# Patient Record
Sex: Female | Born: 2000 | Race: Black or African American | Hispanic: No | Marital: Single | Smoking: Never smoker
Health system: Southern US, Community
[De-identification: ages and names within clinical notes are randomized; demographics above are authoritative.]

---
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
## 2019-01-27 ENCOUNTER — Other Ambulatory Visit: Payer: Self-pay

## 2019-01-27 DIAGNOSIS — Z20822 Contact with and (suspected) exposure to covid-19: Secondary | ICD-10-CM

## 2019-01-27 DIAGNOSIS — Z20828 Contact with and (suspected) exposure to other viral communicable diseases: Secondary | ICD-10-CM | POA: Diagnosis not present

## 2019-01-28 LAB — NOVEL CORONAVIRUS, NAA: SARS-CoV-2, NAA: NOT DETECTED

## 2019-12-27 DIAGNOSIS — B9689 Other specified bacterial agents as the cause of diseases classified elsewhere: Secondary | ICD-10-CM | POA: Diagnosis not present

## 2019-12-27 DIAGNOSIS — N760 Acute vaginitis: Secondary | ICD-10-CM | POA: Diagnosis not present

## 2019-12-27 DIAGNOSIS — N898 Other specified noninflammatory disorders of vagina: Secondary | ICD-10-CM | POA: Diagnosis not present

## 2019-12-27 DIAGNOSIS — N762 Acute vulvitis: Secondary | ICD-10-CM | POA: Diagnosis not present

## 2020-06-24 DIAGNOSIS — U071 COVID-19: Secondary | ICD-10-CM | POA: Diagnosis not present

## 2020-06-24 DIAGNOSIS — R059 Cough, unspecified: Secondary | ICD-10-CM | POA: Diagnosis not present

## 2020-06-26 ENCOUNTER — Telehealth: Payer: Self-pay

## 2020-06-26 NOTE — Telephone Encounter (Signed)
Transition Care Management Unsuccessful Follow-up Telephone Call  Date of discharge and from where:  06/25/2020 from [facility]  Attempts:  1st Attempt  Reason for unsuccessful TCM follow-up call:  Left voice message

## 2020-06-27 NOTE — Telephone Encounter (Signed)
Transition Care Management Unsuccessful Follow-up Telephone Call  Date of discharge and from where:  06/25/2020 from [facility]  Attempts:  2nd Attempt  Reason for unsuccessful TCM follow-up call:  Left voice message

## 2020-06-28 NOTE — Telephone Encounter (Signed)
Transition Care Management Unsuccessful Follow-up Telephone Call  Date of discharge and from where:  06/25/2020 from [facility]  Attempts:  3rd Attempt  Reason for unsuccessful TCM follow-up call:  Unable to reach patient

## 2020-12-06 DIAGNOSIS — Z113 Encounter for screening for infections with a predominantly sexual mode of transmission: Secondary | ICD-10-CM | POA: Diagnosis not present

## 2020-12-06 DIAGNOSIS — B3731 Acute candidiasis of vulva and vagina: Secondary | ICD-10-CM | POA: Diagnosis not present

## 2021-08-04 ENCOUNTER — Encounter (HOSPITAL_COMMUNITY): Payer: Self-pay | Admitting: Emergency Medicine

## 2021-08-04 ENCOUNTER — Emergency Department (HOSPITAL_COMMUNITY)
Admission: EM | Admit: 2021-08-04 | Discharge: 2021-08-05 | Disposition: A | Discharge status: Home / Self Care | Payer: PRIVATE HEALTH INSURANCE | Attending: Emergency Medicine | Admitting: Emergency Medicine

## 2021-08-04 DIAGNOSIS — S61412A Laceration without foreign body of left hand, initial encounter: Secondary | ICD-10-CM | POA: Diagnosis not present

## 2021-08-04 DIAGNOSIS — S6992XA Unspecified injury of left wrist, hand and finger(s), initial encounter: Secondary | ICD-10-CM | POA: Diagnosis present

## 2021-08-04 DIAGNOSIS — Z23 Encounter for immunization: Secondary | ICD-10-CM | POA: Insufficient documentation

## 2021-08-04 DIAGNOSIS — Y990 Civilian activity done for income or pay: Secondary | ICD-10-CM | POA: Diagnosis not present

## 2021-08-04 DIAGNOSIS — W010XXA Fall on same level from slipping, tripping and stumbling without subsequent striking against object, initial encounter: Secondary | ICD-10-CM | POA: Diagnosis not present

## 2021-08-04 NOTE — ED Triage Notes (Addendum)
Pt was leaving work and slipped on the wet floor. She fell with her keys in her had and they lacerated through the webbing of her pinking finger on the L hand. Bleeding controlled with dressing, unsure of last tetanus.

## 2021-08-05 ENCOUNTER — Emergency Department (HOSPITAL_COMMUNITY): Payer: PRIVATE HEALTH INSURANCE

## 2021-08-05 DIAGNOSIS — S61412A Laceration without foreign body of left hand, initial encounter: Secondary | ICD-10-CM | POA: Diagnosis not present

## 2021-08-05 MED ORDER — TETANUS-DIPHTH-ACELL PERTUSSIS 5-2.5-18.5 LF-MCG/0.5 IM SUSY
0.5000 mL | PREFILLED_SYRINGE | Freq: Once | INTRAMUSCULAR | Status: AC
  Administered 2021-08-05: 0.5 mL via INTRAMUSCULAR
  Filled 2021-08-05: qty 0.5

## 2021-08-05 MED ORDER — LIDOCAINE-EPINEPHRINE 2 %-1:100000 IJ SOLN
30.0000 mL | Freq: Once | INTRAMUSCULAR | Status: AC
  Administered 2021-08-05: 30 mL
  Filled 2021-08-05: qty 2

## 2021-08-05 NOTE — ED Notes (Signed)
I provided reinforced discharge education based off of discharge instructions. Pt acknowledged and understood my education. Pt had no further questions/concerns for provider/myself.  °

## 2021-08-05 NOTE — Discharge Instructions (Signed)
You were seen today for a laceration to your left hand.  Apply antibiotic ointment.  Keep it dressed.  Do not submerge.  Return for suture removal in approximately 10 days.

## 2021-08-05 NOTE — ED Provider Notes (Signed)
[facility] DEPT Provider Note   CSN: [id number] Arrival date & time: 08/04/21  2234     History  Chief Complaint  Patient presents with   Laceration    Katie Duarte is a 21 y.o. female.  HPI     This is a 21 year old female who presents with a laceration to the left hand.  Patient reports that she was leaving work when she slipped on the floor falling to the ground.  She reports that she had her keys in her left hand and that her keys lacerated her left hand.  She is right-handed.  Unknown last tetanus.  Reports some pain and swelling in the fourth and fifth digits.  Denies hitting her head or loss of consciousness.  Denies other injury or pain.   Home Medications Prior to Admission medications   Medication Sig Start Date End Date Taking? Authorizing Provider  amoxicillin (AMOXIL) 500 MG capsule Take one capsule by mouth twice a day for 14 days to treat sinusitis 01/26/18   Lurlean Leyden, MD  tretinoin (RETIN-A) 0.025 % cream Apply topically to face nightly. 10/20/16   [provider]      Allergies    Patient has no known allergies.    Review of Systems   Review of Systems  Constitutional:  Negative for fever.  Skin:  Positive for wound. Negative for color change.  All other systems reviewed and are negative.   Physical Exam Updated Vital Signs BP 133/82 (BP Location: Right Arm)   Pulse 71   Temp 97.7 F (36.5 C) (Oral)   Resp 18   Ht 1.6 m ('5\' 3"'$ )   Wt 59 kg   SpO2 100%   BMI 23.03 kg/m  Physical Exam Vitals and nursing note reviewed.  Constitutional:      Appearance: She is well-developed.  HENT:     Head: Normocephalic and atraumatic.  Eyes:     Pupils: Pupils are equal, round, and reactive to light.  Cardiovascular:     Rate and Rhythm: Normal rate and regular rhythm.  Pulmonary:     Effort: Pulmonary effort is normal. No respiratory distress.  Abdominal:     Palpations: Abdomen is soft.   Musculoskeletal:     Cervical back: Neck supple.     Comments: Focused examination of the left hand with slight swelling noted of the fourth and fifth digits, flexion and extension of all 5 digits intact, she has a 4 cm laceration in the webspace between the fourth and fifth digits, bleeding controlled, no tendon exposure or foreign body noted  Skin:    General: Skin is warm and dry.  Neurological:     Mental Status: She is alert and oriented to person, place, and time.  Psychiatric:        Mood and Affect: Mood normal.     ED Results / Procedures / Treatments   Labs (all labs ordered are listed, but only abnormal results are displayed) Labs Reviewed - No data to display  EKG None  Radiology No results found.  Procedures .Marland KitchenLaceration Repair  Date/Time: 08/05/2021 5:49 AM  Performed by: Merryl Hacker, MD Authorized by: Merryl Hacker, MD   Consent:    Consent obtained:  Verbal   Consent given by:  Patient   Risks discussed:  Infection, pain, tendon damage and vascular damage Anesthesia:    Anesthesia method:  Local infiltration   Local anesthetic:  Lidocaine 2% WITH epi Laceration details:  Location:  Hand   Hand location:  L palm   Length (cm):  4   Depth (mm):  5 Pre-procedure details:    Preparation:  Patient was prepped and draped in usual sterile fashion Exploration:    Imaging obtained: x-ray     Imaging outcome: foreign body not noted     Wound exploration: wound explored through full range of motion     Wound extent: no foreign bodies/material noted, no tendon damage noted, no underlying fracture noted and no vascular damage noted     Contaminated: no   Treatment:    Area cleansed with:  Povidone-iodine   Amount of cleaning:  Standard   Irrigation solution:  Sterile saline   Visualized foreign bodies/material removed: no     Debridement:  None   Undermining:  None Skin repair:    Repair method:  Sutures   Suture size:  4-0   Suture  material:  Nylon   Suture technique:  Simple interrupted   Number of sutures:  4 Approximation:    Approximation:  Close Repair type:    Repair type:  Simple Post-procedure details:    Dressing:  Non-adherent dressing   Procedure completion:  Tolerated     Medications Ordered in ED Medications  Tdap (BOOSTRIX) injection 0.5 mL (0.5 mLs Intramuscular Given 08/05/21 0515)  lidocaine-EPINEPHrine (XYLOCAINE W/EPI) 2 %-1:100000 (with pres) injection 30 mL (30 mLs Infiltration Given by Other 08/05/21 5427)    ED Course/ Medical Decision Making/ A&P                           Medical Decision Making Amount and/or Complexity of Data Reviewed Radiology: ordered.  Risk Prescription drug management.   This patient presents to the ED for concern of laceration to the left hand, this involves an extensive number of treatment options, and is a complaint that carries with it a high risk of complications and morbidity.  I considered the following differential and admission for this acute, potentially life threatening condition.  The differential diagnosis includes laceration, tendon injury, fracture  MDM:    This is a 21 year old female who fell on the floor with her keys in her hand.  She sustained a laceration.  Appears to be simple laceration between the web spacing of the fourth and fifth digits.  Will obtain an x-ray to rule out fracture of the fourth and fifth digits given slight swelling.  Flexion extension are intact.  No obvious tendon damage.  Laceration was repaired at bedside.  X-rays negative.  Patient was given laceration care instructions.  Tetanus was updated  (Labs, imaging, consults)  Labs: I Ordered, and personally interpreted labs.  The pertinent results include: None  Imaging Studies ordered: I ordered imaging studies including x-ray hand I independently visualized and interpreted imaging. I agree with the radiologist interpretation  Additional history obtained from  mother.  External records from outside source obtained and reviewed including prior evaluations  Cardiac Monitoring: The patient was maintained on a cardiac monitor.  I personally viewed and interpreted the cardiac monitored which showed an underlying rhythm of: Sinus rhythm  Reevaluation: After the interventions noted above, I reevaluated the patient and found that they have :improved  Social Determinants of Health: Lives independently  Disposition: Discharge  Co morbidities that complicate the patient evaluation History reviewed. No pertinent past medical history.   Medicines Meds ordered this encounter  Medications   Tdap (BOOSTRIX) injection 0.5 mL  lidocaine-EPINEPHrine (XYLOCAINE W/EPI) 2 %-1:100000 (with pres) injection 30 mL    I have reviewed the patients home medicines and have made adjustments as needed  Problem List / ED Course: Problem List Items Addressed This Visit   None Visit Diagnoses     Laceration of left hand, foreign body presence unspecified, initial encounter    -  Primary                   Final Clinical Impression(s) / ED Diagnoses Final diagnoses:  Laceration of left hand, foreign body presence unspecified, initial encounter    Rx / DC Orders ED Discharge Orders     None         Mathayus Stanbery, Barbette Hair, MD 08/05/21 [phone]

## 2021-08-26 ENCOUNTER — Other Ambulatory Visit: Payer: Self-pay

## 2021-08-26 ENCOUNTER — Encounter (HOSPITAL_COMMUNITY): Payer: Self-pay

## 2021-08-26 ENCOUNTER — Emergency Department (HOSPITAL_COMMUNITY)
Admission: EM | Admit: 2021-08-26 | Discharge: 2021-08-26 | Disposition: A | Discharge status: Home / Self Care | Payer: PRIVATE HEALTH INSURANCE | Attending: Emergency Medicine | Admitting: Emergency Medicine

## 2021-08-26 DIAGNOSIS — Z4802 Encounter for removal of sutures: Secondary | ICD-10-CM | POA: Insufficient documentation

## 2021-08-26 DIAGNOSIS — S61217D Laceration without foreign body of left little finger without damage to nail, subsequent encounter: Secondary | ICD-10-CM | POA: Diagnosis not present

## 2021-08-26 NOTE — ED Provider Notes (Signed)
[facility] DEPT Provider Note   CSN: [id number] Arrival date & time: 08/26/21  1707     History  Chief Complaint  Patient presents with   Suture / Staple Removal    Katie Duarte is a 21 y.o. female.  The history is provided by the patient.  Suture / Staple Removal    21 year old female presenting requesting for suture removal in her left pinky finger.  It was placed in June 14.  Patient denies any symptoms pain fever redness.  Home Medications Prior to Admission medications   Medication Sig Start Date End Date Taking? Authorizing Provider  amoxicillin (AMOXIL) 500 MG capsule Take one capsule by mouth twice a day for 14 days to treat sinusitis 01/26/18   Lurlean Leyden, MD  tretinoin (RETIN-A) 0.025 % cream Apply topically to face nightly. 10/20/16   [provider]      Allergies    Patient has no known allergies.    Review of Systems   Review of Systems  All other systems reviewed and are negative.   Physical Exam Updated Vital Signs BP 123/73   Pulse 87   Temp 98.1 F (36.7 C) (Oral)   Resp 16   Ht '5\' 3"'$  (1.6 m)   Wt 59 kg   LMP 08/22/2021   SpO2 98%   BMI 23.03 kg/m  Physical Exam Vitals and nursing note reviewed.  Constitutional:      General: She is not in acute distress.    Appearance: She is well-developed.  HENT:     Head: Atraumatic.  Eyes:     Conjunctiva/sclera: Conjunctivae normal.  Pulmonary:     Effort: Pulmonary effort is normal.  Musculoskeletal:     Cervical back: Neck supple.  Skin:    Findings: No rash.     Comments: Left hand: 4 sutures noted in between the webspace between pinky finger and ring finger without signs of infection.  Neurological:     Mental Status: She is alert.  Psychiatric:        Mood and Affect: Mood normal.     ED Results / Procedures / Treatments   Labs (all labs ordered are listed, but only abnormal results are displayed) Labs Reviewed - No data to  display  EKG None  Radiology No results found.  Procedures Procedures    Medications Ordered in ED Medications - No data to display  ED Course/ Medical Decision Making/ A&P                           Medical Decision Making  BP 123/73   Pulse 87   Temp 98.1 F (36.7 C) (Oral)   Resp 16   Ht '5\' 3"'$  (1.6 m)   Wt 59 kg   LMP 08/22/2021   SpO2 98%   BMI 23.03 kg/m   SUTURE REMOVAL Performed by: Domenic Moras  Consent: Verbal consent obtained. Patient identity confirmed: provided demographic data Time out: Immediately prior to procedure a "time out" was called to verify the correct patient, procedure, equipment, support staff and site/side marked as required.  Location details: L hand (webspace between pinky and ring finger)  Wound Appearance: clean  Sutures/Staples Removed: 4 sutures  Facility: sutures placed in this facility Patient tolerance: Patient tolerated the procedure well with no immediate complications.          Final Clinical Impression(s) / ED Diagnoses Final diagnoses:  Visit for suture removal  Rx / DC Orders ED Discharge Orders     None         Domenic Moras, PA-C 08/26/21 1743    Daleen Bo, MD 08/27/21 [phone]

## 2021-08-26 NOTE — ED Triage Notes (Signed)
Patient is requesting suture removal to the left palm/pinky finger area to be removed.

## 2022-03-06 ENCOUNTER — Emergency Department (HOSPITAL_COMMUNITY)
Admission: EM | Admit: 2022-03-06 | Discharge: 2022-03-06 | Disposition: A | Discharge status: Home / Self Care | Payer: PRIVATE HEALTH INSURANCE | Attending: Emergency Medicine | Admitting: Emergency Medicine

## 2022-03-06 ENCOUNTER — Encounter (HOSPITAL_COMMUNITY): Payer: Self-pay

## 2022-03-06 DIAGNOSIS — R309 Painful micturition, unspecified: Secondary | ICD-10-CM | POA: Insufficient documentation

## 2022-03-06 DIAGNOSIS — R300 Dysuria: Secondary | ICD-10-CM | POA: Diagnosis present

## 2022-03-06 DIAGNOSIS — Z113 Encounter for screening for infections with a predominantly sexual mode of transmission: Secondary | ICD-10-CM

## 2022-03-06 DIAGNOSIS — Z202 Contact with and (suspected) exposure to infections with a predominantly sexual mode of transmission: Secondary | ICD-10-CM | POA: Insufficient documentation

## 2022-03-06 DIAGNOSIS — N3000 Acute cystitis without hematuria: Secondary | ICD-10-CM

## 2022-03-06 LAB — CBC
HCT: 39.9 % (ref 36.0–46.0)
Hemoglobin: 13.6 g/dL (ref 12.0–15.0)
MCH: 29.9 pg (ref 26.0–34.0)
MCHC: 34.1 g/dL (ref 30.0–36.0)
MCV: 87.7 fL (ref 80.0–100.0)
Platelets: 266 10*3/uL (ref 150–400)
RBC: 4.55 MIL/uL (ref 3.87–5.11)
RDW: 12.1 % (ref 11.5–15.5)
WBC: 8.2 10*3/uL (ref 4.0–10.5)
nRBC: 0 % (ref 0.0–0.2)

## 2022-03-06 LAB — URINALYSIS, ROUTINE W REFLEX MICROSCOPIC
Bilirubin Urine: NEGATIVE
Glucose, UA: NEGATIVE mg/dL
Hgb urine dipstick: NEGATIVE
Ketones, ur: 5 mg/dL — AB
Nitrite: NEGATIVE
Protein, ur: NEGATIVE mg/dL
Specific Gravity, Urine: 1.014 (ref 1.005–1.030)
pH: 5 (ref 5.0–8.0)

## 2022-03-06 LAB — BASIC METABOLIC PANEL
Anion gap: 11 (ref 5–15)
BUN: 14 mg/dL (ref 6–20)
CO2: 21 mmol/L — ABNORMAL LOW (ref 22–32)
Calcium: 9.3 mg/dL (ref 8.9–10.3)
Chloride: 105 mmol/L (ref 98–111)
Creatinine, Ser: 0.88 mg/dL (ref 0.44–1.00)
GFR, Estimated: >60 mL/min (ref >60)
Glucose, Bld: 94 mg/dL (ref 70–99)
Potassium: 3.6 mmol/L (ref 3.5–5.1)
Sodium: 137 mmol/L (ref 135–145)

## 2022-03-06 MED ORDER — CEFADROXIL 500 MG PO CAPS: 500.0000 mg | ORAL_CAPSULE | Freq: Two times a day (BID) | ORAL | 0 refills | Status: DC

## 2022-03-06 MED ORDER — CEFADROXIL 500 MG PO CAPS: 500.0000 mg | ORAL_CAPSULE | Freq: Two times a day (BID) | ORAL | 0 refills | Status: AC

## 2022-03-06 NOTE — ED Triage Notes (Signed)
Pt states she went to [facility] Saturday, was dx with BV. Pt states she got her medicine on Monday but is still having burning urination and itching. Pt is concerned for other STDs, is not sure that she was tested for everything.

## 2022-03-06 NOTE — Discharge Instructions (Addendum)
Please follow-up in 24 to 48 hours to check the results of your STI screening, as we discussed RPR is the test which determines whether or not you have syphilis, if any of your results are positive please return for further evaluation and treatment, if your HSV blood results are positive this does not necessarily mean that you have genital HSV, as these viruses are very common and can be associated with run-of-the-mill cold sores, unless you develop genital ulcers I would not think that you have an active genital HSV infection.  Please take the entire course of the antibiotic that I am prescribing in addition to finishing your course of metronidazole to help treat BV.

## 2022-03-06 NOTE — ED Provider Notes (Signed)
[facility] Provider Note   CSN: [id number] Arrival date & time: 03/06/22  1623     History  Chief Complaint  Patient presents with   SEXUALLY TRANSMITTED DISEASE    Katie Duarte is a 22 y.o. female Pt complains of ongoing dysuria, burning with urination for the last several days, patient was seen and evaluated at urgent care, reports that she tested positive for BV, negative for other STIs, patient wants to "make sure" that nothing else is going on.  She has been taking her metronidazole as directed.  She is requesting testing for other blood-borne sexually transmitted infections at this time.  She denies specific dyspareunia, reports some thin vaginal discharge.  She denies fever, chills, abdominal pain.  Patient appears very anxious in triage.  HPI     Home Medications Prior to Admission medications   Medication Sig Start Date End Date Taking? Authorizing Provider  amoxicillin (AMOXIL) 500 MG capsule Take one capsule by mouth twice a day for 14 days to treat sinusitis 01/26/18   Lurlean Leyden, MD  tretinoin (RETIN-A) 0.025 % cream Apply topically to face nightly. 10/20/16   [provider]      Allergies    Patient has no known allergies.    Review of Systems   Review of Systems  All other systems reviewed and are negative.   Physical Exam Updated Vital Signs BP (!) 153/95   Pulse (!) 117   Temp 97.7 F (36.5 C) (Oral)   Resp 16   Ht '5\' 4"'$  (1.626 m)   Wt 62.1 kg   LMP 02/07/2022   SpO2 100%   BMI 23.52 kg/m  Physical Exam Vitals and nursing note reviewed.  Constitutional:      General: She is not in acute distress.    Appearance: Normal appearance.  HENT:     Head: Normocephalic and atraumatic.  Eyes:     General:        Right eye: No discharge.        Left eye: No discharge.  Cardiovascular:     Rate and Rhythm: Normal rate and regular rhythm.  Pulmonary:     Effort: Pulmonary effort is  normal. No respiratory distress.  Abdominal:     Comments: No significant tenderness to palpation of the suprapubic region  Musculoskeletal:        General: No deformity.  Skin:    General: Skin is warm and dry.  Neurological:     Mental Status: She is alert and oriented to person, place, and time.  Psychiatric:        Mood and Affect: Mood normal.        Behavior: Behavior normal.     ED Results / Procedures / Treatments   Labs (all labs ordered are listed, but only abnormal results are displayed) Labs Reviewed  URINE CULTURE  URINALYSIS, ROUTINE W REFLEX MICROSCOPIC  CBC  BASIC METABOLIC PANEL  HIV ANTIBODY (ROUTINE TESTING W REFLEX)  RPR  HSV 1 ANTIBODY, IGG  HSV 2 ANTIBODY, IGG    EKG None  Radiology No results found.  Procedures Procedures    Medications Ordered in ED Medications - No data to display  ED Course/ Medical Decision Making/ A&P                             Medical Decision Making Amount and/or Complexity of Data Reviewed Labs: ordered.  Risk Prescription drug management.   This patient is a 22 y.o. female  who presents to the ED for concern of ongoing dysuria, vaginal itching, in context of recently diagnosed BV around 4 days ago.   Differential diagnoses prior to evaluation: The emergent differential diagnosis includes, but is not limited to, acute UTI, pyelonephritis, nephrolithiasis, ongoing BV, yeast infection, other sexually transmitted infection, based on patient's description of symptoms I have a lower active suspicion for HSV, HIV, syphilis or other more serious sexually-transmitted infection, overall clinically I have a high suspicion for either ongoing BV or UTI. This is not an exhaustive differential.   Physical Exam: Physical exam performed. The pertinent findings include: Patient does arrive with tachycardia, pulse of 117, however she is very anxious on my exam, we did not get a documented repeat set of vitals but on my exam,  pulse palpated at 97 bpm prior to discharge.  She does not have a fever, she is stable nondistressed respirations, stable oxygen saturation, she also has some moderate hypertension, blood pressure 153/95.  Given patient's thorough history I do not think a repeat pelvic exam is necessary at this time, will wait until we evaluate her lab work, if no evidence of UTI or other clear cause for her ongoing symptoms she may benefit from repeat pelvic exam  Lab Tests/Imaging studies: I personally interpreted labs/imaging and the pertinent results include:  UA shows some ketones, moderate leukocytes, white blood cells, and bacteria consistent with active urinary tract infection, we will send it for culture.  BMP, CBC unremarkable.  Other STI testing is pending, patient encouraged to follow-up in 24 to 48 hours to check on her results.   Medications: Patient discharged with cefadroxil in addition to encouraged to continue her metronidazole.   Disposition: After consideration of the diagnostic results and the patients response to treatment, I feel that patient symptoms are consistent with acute UTI on top of known bacterial vaginosis.   emergency department workup does not suggest an emergent condition requiring admission or immediate intervention beyond what has been performed at this time. The plan is: as above, abx for acute cystitis. The patient is safe for discharge and has been instructed to return immediately for worsening symptoms, change in symptoms or any other concerns.  Final Clinical Impression(s) / ED Diagnoses Final diagnoses:  None    Rx / DC Orders ED Discharge Orders     None         Dorien Chihuahua 03/06/22 2137    Drenda Freeze, MD 03/06/22 [phone]

## 2022-03-07 LAB — RPR: RPR Ser Ql: NONREACTIVE

## 2022-03-07 LAB — HSV 2 ANTIBODY, IGG: HSV 2 Glycoprotein G Ab, IgG: <0.91 index (ref 0.00–0.90)

## 2022-03-07 LAB — HIV ANTIBODY (ROUTINE TESTING W REFLEX): HIV Screen 4th Generation wRfx: NONREACTIVE

## 2022-03-07 LAB — HSV 1 ANTIBODY, IGG: HSV 1 Glycoprotein G Ab, IgG: <0.91 index (ref 0.00–0.90)

## 2022-03-08 LAB — URINE CULTURE: Culture: <10000 — AB

## 2023-02-27 IMAGING — CR DG HAND COMPLETE 3+V*L*
3 series · 3 of 3 positions shown · non-contrast
Comparison: None Available.

CLINICAL DATA: Fall with little finger laceration

EXAM:
LEFT HAND - COMPLETE 3 VIEW

[x hand pa left]
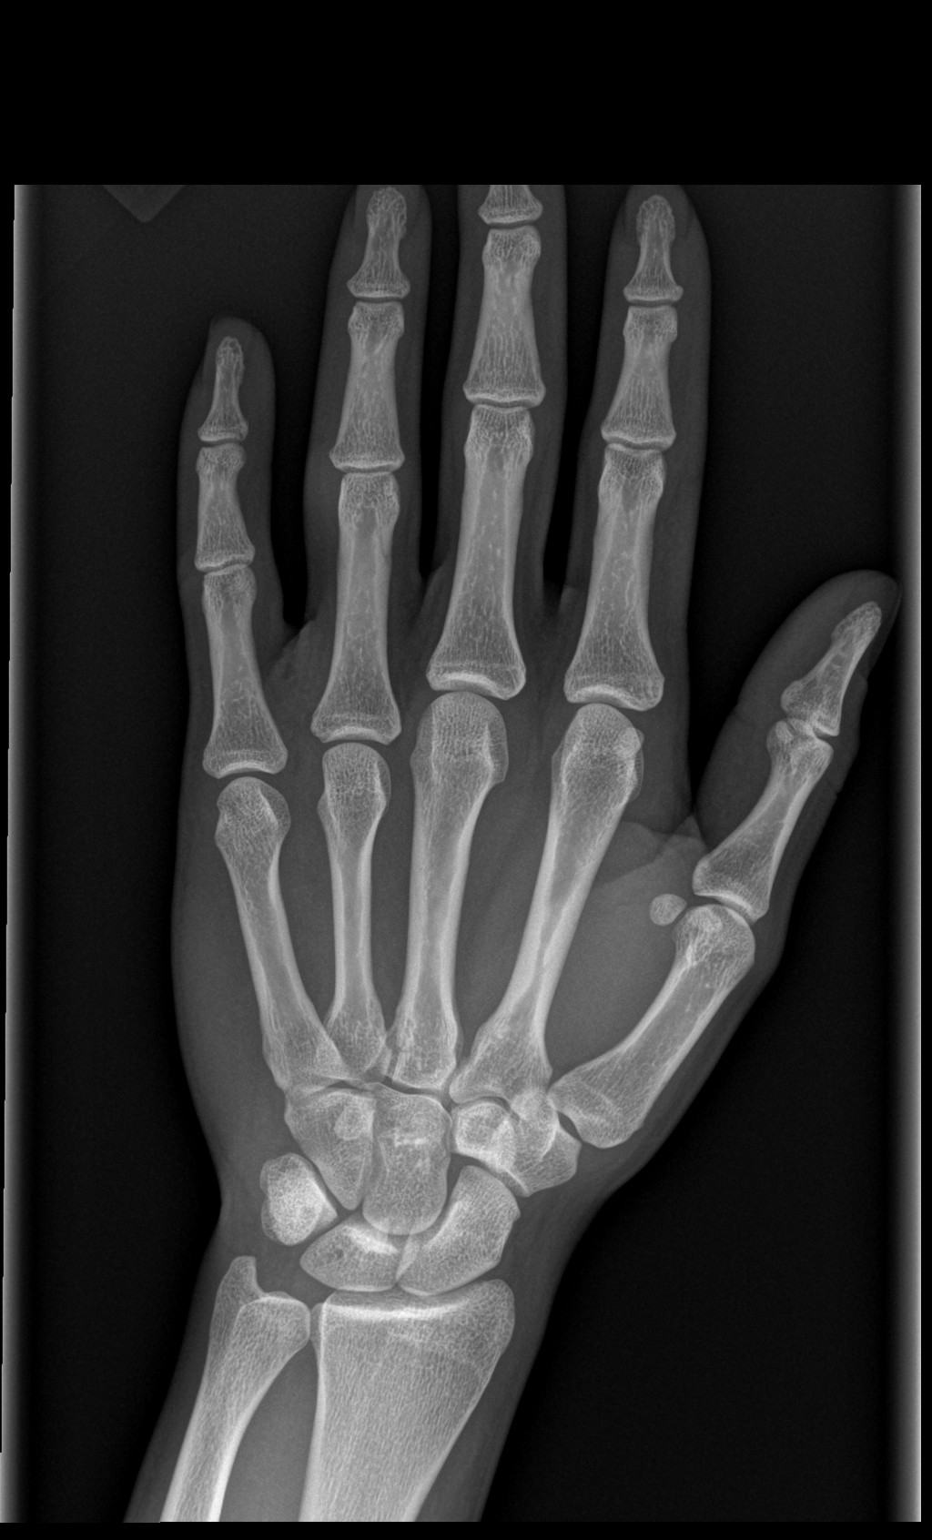

[x hand obl left]
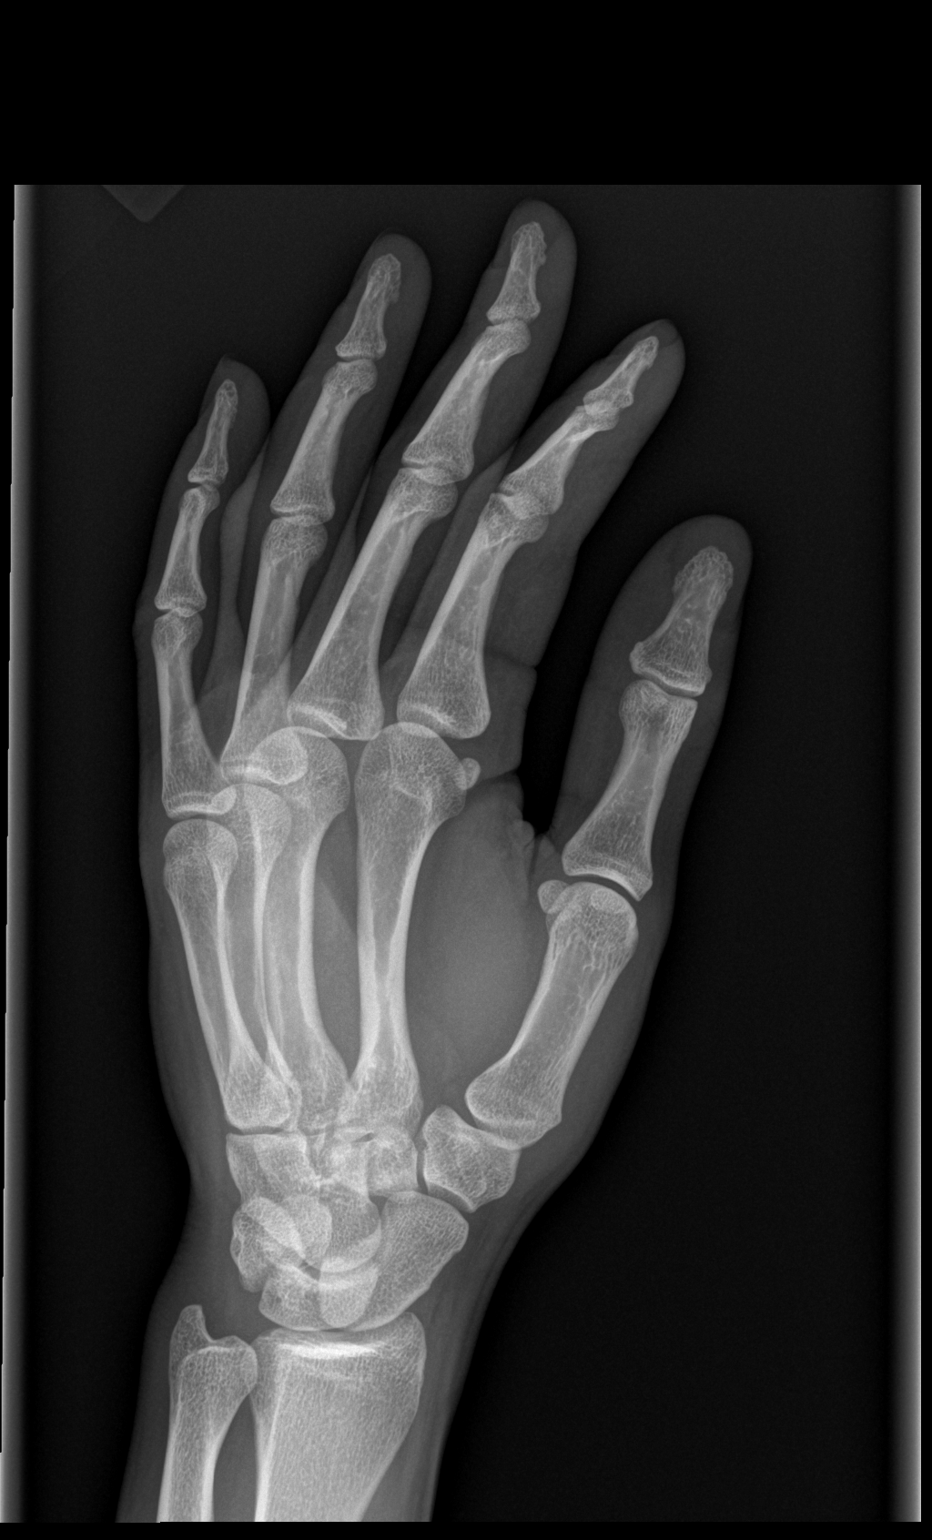

[x hand lat left]
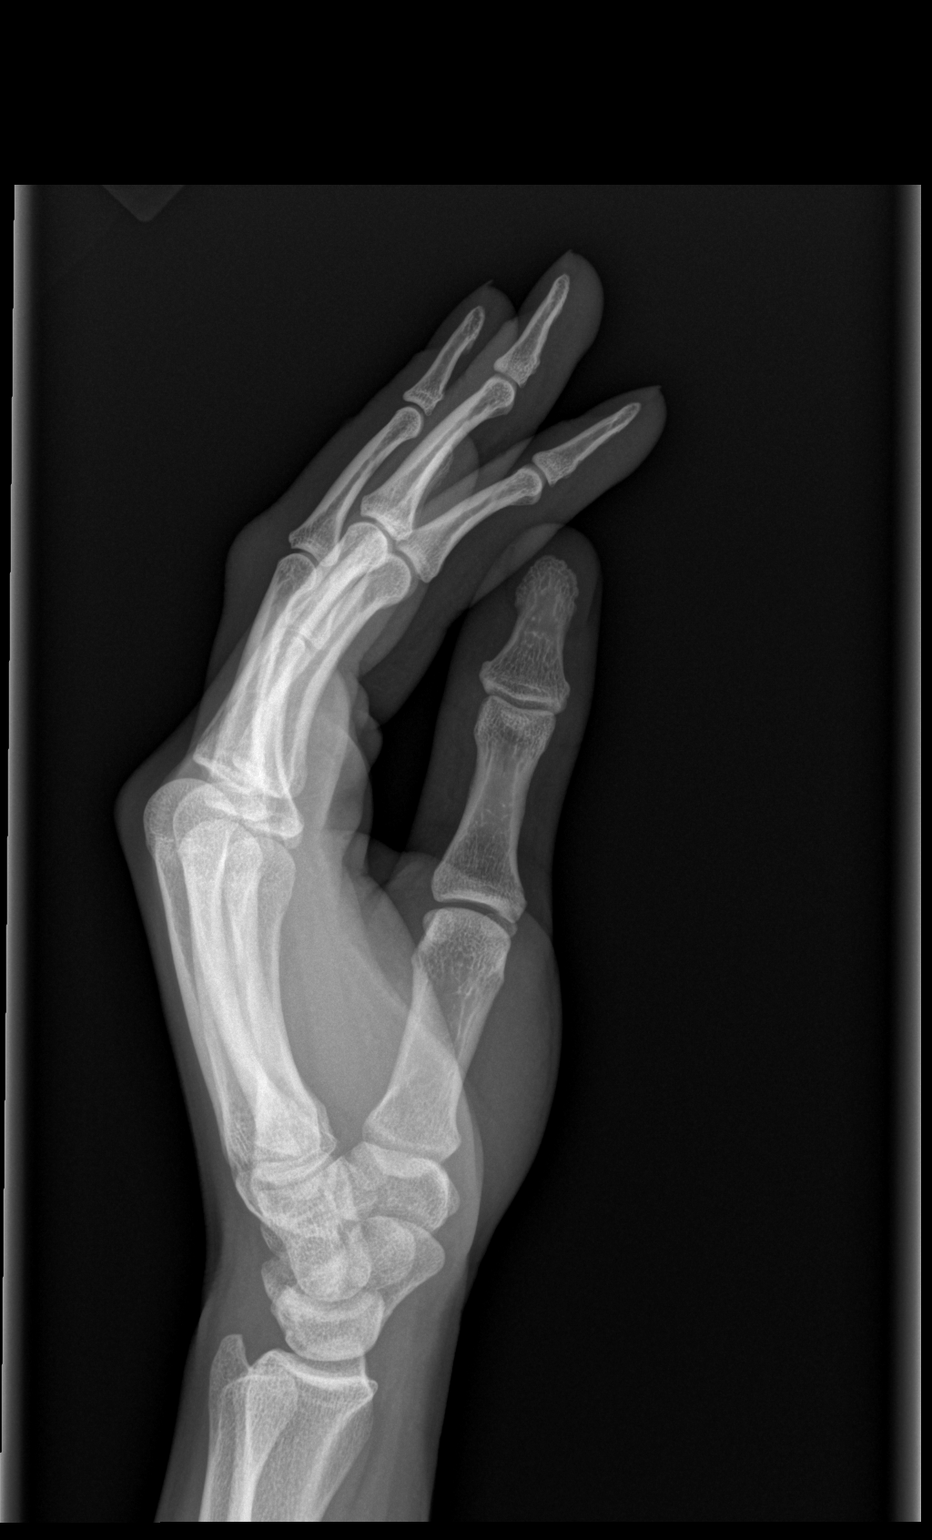

[3 of 3 positions shown; findings below may reference images not displayed]

FINDINGS: There is no evidence of fracture or dislocation. There is no
evidence of arthropathy or other focal bone abnormality. Soft
tissues are unremarkable.
IMPRESSION: Negative.
